# Patient Record
Sex: Male | Born: 1935 | Race: White | Hispanic: No | Marital: Married | Smoking: Never smoker
Health system: Southern US, Community
[De-identification: ages and names within clinical notes are randomized; demographics above are authoritative.]

## PROBLEM LIST (undated history)

## (undated) DIAGNOSIS — I499 Cardiac arrhythmia, unspecified: Secondary | ICD-10-CM

## (undated) HISTORY — PX: HERNIA REPAIR: SHX51

---

## 2001-05-02 ENCOUNTER — Encounter: Payer: Self-pay | Admitting: Urology

## 2001-05-02 ENCOUNTER — Encounter: Admission: RE | Admit: 2001-05-02 | Discharge: 2001-05-02 | Payer: Self-pay | Admitting: Urology

## 2001-05-03 ENCOUNTER — Ambulatory Visit (HOSPITAL_BASED_OUTPATIENT_CLINIC_OR_DEPARTMENT_OTHER): Admission: RE | Admit: 2001-05-03 | Discharge: 2001-05-03 | Payer: Self-pay | Admitting: Urology

## 2010-07-14 ENCOUNTER — Other Ambulatory Visit: Payer: Self-pay | Admitting: Internal Medicine

## 2010-07-14 DIAGNOSIS — N289 Disorder of kidney and ureter, unspecified: Secondary | ICD-10-CM

## 2010-08-04 ENCOUNTER — Other Ambulatory Visit: Payer: Self-pay

## 2012-11-04 ENCOUNTER — Emergency Department (HOSPITAL_COMMUNITY): Payer: Medicare Other

## 2012-11-04 ENCOUNTER — Emergency Department (HOSPITAL_COMMUNITY)
Admission: EM | Admit: 2012-11-04 | Discharge: 2012-11-04 | Disposition: A | Discharge status: Home / Self Care | Payer: Medicare Other | Attending: Emergency Medicine | Admitting: Emergency Medicine

## 2012-11-04 ENCOUNTER — Encounter (HOSPITAL_COMMUNITY): Payer: Self-pay | Admitting: Emergency Medicine

## 2012-11-04 DIAGNOSIS — F411 Generalized anxiety disorder: Secondary | ICD-10-CM | POA: Insufficient documentation

## 2012-11-04 DIAGNOSIS — I499 Cardiac arrhythmia, unspecified: Secondary | ICD-10-CM | POA: Insufficient documentation

## 2012-11-04 DIAGNOSIS — I498 Other specified cardiac arrhythmias: Secondary | ICD-10-CM

## 2012-11-04 DIAGNOSIS — R638 Other symptoms and signs concerning food and fluid intake: Secondary | ICD-10-CM | POA: Insufficient documentation

## 2012-11-04 DIAGNOSIS — R5381 Other malaise: Secondary | ICD-10-CM | POA: Insufficient documentation

## 2012-11-04 DIAGNOSIS — Z7982 Long term (current) use of aspirin: Secondary | ICD-10-CM | POA: Insufficient documentation

## 2012-11-04 DIAGNOSIS — R531 Weakness: Secondary | ICD-10-CM

## 2012-11-04 DIAGNOSIS — Z8679 Personal history of other diseases of the circulatory system: Secondary | ICD-10-CM | POA: Insufficient documentation

## 2012-11-04 DIAGNOSIS — R42 Dizziness and giddiness: Secondary | ICD-10-CM | POA: Insufficient documentation

## 2012-11-04 DIAGNOSIS — Z79899 Other long term (current) drug therapy: Secondary | ICD-10-CM | POA: Insufficient documentation

## 2012-11-04 DIAGNOSIS — R5383 Other fatigue: Secondary | ICD-10-CM

## 2012-11-04 DIAGNOSIS — E039 Hypothyroidism, unspecified: Secondary | ICD-10-CM | POA: Insufficient documentation

## 2012-11-04 DIAGNOSIS — F419 Anxiety disorder, unspecified: Secondary | ICD-10-CM

## 2012-11-04 HISTORY — DX: Cardiac arrhythmia, unspecified: I49.9

## 2012-11-04 LAB — CBC WITH DIFFERENTIAL/PLATELET
HCT: 36.2 % — ABNORMAL LOW (ref 39.0–52.0)
Hemoglobin: 13 g/dL (ref 13.0–17.0)
Lymphocytes Relative: 6 % — ABNORMAL LOW (ref 12–46)
Lymphs Abs: 0.8 10*3/uL (ref 0.7–4.0)
Monocytes Absolute: 1.5 10*3/uL — ABNORMAL HIGH (ref 0.1–1.0)
Monocytes Relative: 11 % (ref 3–12)
Neutro Abs: 10.6 10*3/uL — ABNORMAL HIGH (ref 1.7–7.7)
Neutrophils Relative %: 81 % — ABNORMAL HIGH (ref 43–77)
RBC: 3.9 MIL/uL — ABNORMAL LOW (ref 4.22–5.81)

## 2012-11-04 LAB — URINE MICROSCOPIC-ADD ON

## 2012-11-04 LAB — COMPREHENSIVE METABOLIC PANEL
Albumin: 2.1 g/dL — ABNORMAL LOW (ref 3.5–5.2)
Alkaline Phosphatase: 66 U/L (ref 39–117)
BUN: 19 mg/dL (ref 6–23)
CO2: 20 mEq/L (ref 19–32)
Chloride: 95 mEq/L — ABNORMAL LOW (ref 96–112)
Creatinine, Ser: 1.1 mg/dL (ref 0.50–1.35)
GFR calc non Af Amer: 59 mL/min — ABNORMAL LOW (ref >90)
Glucose, Bld: 132 mg/dL — ABNORMAL HIGH (ref 70–99)
Potassium: 4.1 mEq/L (ref 3.5–5.1)
Total Bilirubin: 0.3 mg/dL (ref 0.3–1.2)

## 2012-11-04 LAB — URINALYSIS, ROUTINE W REFLEX MICROSCOPIC
Glucose, UA: NEGATIVE mg/dL
Ketones, ur: NEGATIVE mg/dL
Leukocytes, UA: NEGATIVE
Protein, ur: 30 mg/dL — AB
Urobilinogen, UA: 1 mg/dL (ref 0.0–1.0)

## 2012-11-04 MED ORDER — SODIUM CHLORIDE 0.9 % IV BOLUS (SEPSIS)
500.0000 mL | Freq: Once | INTRAVENOUS | Status: AC
  Administered 2012-11-04: 500 mL via INTRAVENOUS

## 2012-11-04 NOTE — ED Notes (Signed)
Patient can not get an urine at this time will try later 

## 2012-11-04 NOTE — ED Notes (Signed)
Cardiology at bedside.

## 2012-11-04 NOTE — ED Notes (Signed)
Onset 3 months ago general weakness loss of appetite seen Primary Doctor twice past week last being one day ago.  Primary Doctor referred patient to Cardiology however have not made appointment at this time.  Called Cardiology sent patient to ED for evaluation. Patient alert answering and following commands appropriate.

## 2012-11-04 NOTE — ED Notes (Signed)
1920  Received report from off going RN and introduced myself to the pt.  No new needs at this time but will continue to monitor

## 2012-11-04 NOTE — ED Provider Notes (Signed)
  Physical Exam  BP 129/82  Pulse 102  Temp(Src) 97.7 F (36.5 C) (Oral)  Resp 16  SpO2 96%  Physical Exam  ED Course  Procedures  MDM Dr. Ranae Palms signing out patient to me. States that patient had come in for generalized weakness. Has non specific leukocytosis with no source of infection and mild hyponatremia. He was to d/c the patient, when patients HR became erratic. [facility] was called -and they have recommended close follow up. Pt is comfortable at this time with the plan to be discharged. He is slightly tachycardic. He is aware that he needs to return to the ER immediately if symptoms get worse. Family at bedside, understand the plan as well.  Joshua Kaplan, MD 11/04/12 1944

## 2012-11-04 NOTE — Consult Note (Signed)
Pt. Seen and examined. Agree with the NP/PA-C note as written. Pleasant 79 yo male who is going to be seeing Dr. Alanda Amass for sinus arrythmia. Apparently he was told of this abnormal rhythm when he was trying to enlist in the [employer] 60 years ago. He now presents with 2 weeks of progressive weakness, secondary to URI which has not totally resolved. He was given antibiotics yesterday, but has only taken 1 pill.  On admission, labs indicate significant dehydration. He was given 1.5L of normal saline and is feeling stronger. There is a leukocytosis. He denies chest pain, syncope, pre-syncope, palpitations or any associated symptoms.  EKG shows "grouped beating".  Impression: 1. Weakness secondary to dehydration and infection 2. Arrythmia  Plan: 1.  I don't feel that his weakness is related to his long-standing arrythmia. The office will contact him on Monday to schedule a follow-up appointment with Dr. Alanda Amass. Probably can go home to continue oral antibiotics, get rest and adequate hydration, as per the ER physician's discretion.   Thanks for consulting [place].  Chrystie Nose, MD, [facility] Attending Cardiologist [facility]

## 2012-11-04 NOTE — ED Notes (Signed)
Paged [facility] w/[facility] to [phone]

## 2012-11-04 NOTE — ED Provider Notes (Signed)
CSN: [id number]     Arrival date & time 11/04/12  4098 History     First MD Initiated Contact with Patient 11/04/12 [phone]     Chief Complaint  Patient presents with  . Weakness   (Consider location/radiation/quality/duration/timing/severity/associated sxs/prior Treatment) HPI Pt with several weeks of "weakness" episodes. States they are associated with lightheadedness. No fever, chills, CP, SOB, cough, abd pain, N/V/D, urinary symptoms or focal weakness/numbness. Has known "irregular" heart beat he was diagnosed with >60 years ago. Wife states he was recently place on abx for elevated WBC but no known focus of infection. Pt c/o dry mouth and decreased PO intake.  Past Medical History  Diagnosis Date  . Irregular heartbeat    Past Surgical History  Procedure Laterality Date  . Hernia repair     No family history on file. History  Substance Use Topics  . Smoking status: Never Smoker   . Smokeless tobacco: Not on file  . Alcohol Use: No    Review of Systems  Constitutional: Positive for appetite change and fatigue. Negative for fever and chills.  HENT: Negative for neck pain and neck stiffness.   Eyes: Negative for visual disturbance.  Respiratory: Negative for cough, shortness of breath and wheezing.   Cardiovascular: Negative for chest pain, palpitations and leg swelling.  Gastrointestinal: Negative for nausea, vomiting, abdominal pain and diarrhea.  Genitourinary: Negative for dysuria, frequency and hematuria.  Musculoskeletal: Negative for myalgias and back pain.  Skin: Negative for rash and wound.  Neurological: Positive for dizziness, weakness (generalized) and light-headedness. Negative for syncope, numbness and headaches.  All other systems reviewed and are negative.    Allergies  Fish oil  Home Medications   Current Outpatient Rx  Name  Route  Sig  Dispense  Refill  . ALPRAZolam (XANAX) 0.25 MG tablet   Oral   Take 0.25 mg by mouth at bedtime as needed for  sleep.         Marland Kitchen aspirin EC 81 MG tablet   Oral   Take 81 mg by mouth daily.         . Cholecalciferol (VITAMIN D-3 PO)   Oral   Take 1 tablet by mouth daily.         . Choline Fenofibrate (TRILIPIX) 45 MG capsule   Oral   Take 45 mg by mouth daily.         . Cranberry 500 MG CAPS   Oral   Take 1 capsule by mouth daily.         Marland Kitchen levothyroxine (SYNTHROID, LEVOTHROID) 50 MCG tablet   Oral   Take 50 mcg by mouth daily before breakfast.         . mirtazapine (REMERON) 15 MG tablet   Oral   Take 15 mg by mouth at bedtime.         . pantoprazole (PROTONIX) 40 MG tablet   Oral   Take 40 mg by mouth daily.         . rosuvastatin (CRESTOR) 20 MG tablet   Oral   Take 20 mg by mouth daily.         . vitamin B-12 (CYANOCOBALAMIN) 1000 MCG tablet   Oral   Take 1,000 mcg by mouth daily.         . vitamin C (ASCORBIC ACID) 500 MG tablet   Oral   Take 500 mg by mouth daily.          BP 132/75  Pulse 101  Temp(Src) 97.7 F (36.5 C) (Oral)  Resp 18  SpO2 95% Physical Exam  Nursing note and vitals reviewed. Constitutional: He is oriented to person, place, and time. He appears well-developed. No distress.  Dry and wasted appearing  HENT:  Head: Normocephalic and atraumatic.  Dry MM  Eyes: EOM are normal. Pupils are equal, round, and reactive to light.  Neck: Normal range of motion. Neck supple.  Cardiovascular:  Tachy irreg irreg  Pulmonary/Chest: Effort normal and breath sounds normal. No respiratory distress. He has no wheezes. He has no rales. He exhibits no tenderness.  Abdominal: Soft. Bowel sounds are normal. He exhibits no distension and no mass. There is no tenderness. There is no rebound and no guarding.  Musculoskeletal: Normal range of motion. He exhibits no edema and no tenderness.  Neurological: He is alert and oriented to person, place, and time.  5/5 motor in all ext, sensation intact  Skin: Skin is warm and dry. No rash noted. No  erythema.  Psychiatric: He has a normal mood and affect. His behavior is normal.    ED Course   Procedures (including critical care time)  Labs Reviewed  CBC WITH DIFFERENTIAL - Abnormal; Notable for the following:    WBC 13.0 (*)    RBC 3.90 (*)    HCT 36.2 (*)    Platelets 503 (*)    Neutrophils Relative % 81 (*)    Neutro Abs 10.6 (*)    Lymphocytes Relative 6 (*)    Monocytes Absolute 1.5 (*)    All other components within normal limits  COMPREHENSIVE METABOLIC PANEL - Abnormal; Notable for the following:    Sodium 127 (*)    Chloride 95 (*)    Glucose, Bld 132 (*)    Albumin 2.1 (*)    AST 68 (*)    GFR calc non Af Amer 59 (*)    GFR calc Af Amer 69 (*)    All other components within normal limits  URINALYSIS, ROUTINE W REFLEX MICROSCOPIC - Abnormal; Notable for the following:    Protein, ur 30 (*)    All other components within normal limits  URINE MICROSCOPIC-ADD ON - Abnormal; Notable for the following:    Squamous Epithelial / LPF FEW (*)    Crystals CA OXALATE CRYSTALS (*)    All other components within normal limits  POCT I-STAT TROPONIN I   Dg Chest 2 View  11/04/2012   *RADIOLOGY REPORT*  Clinical Data: Weakness.  CHEST - 2 VIEW  Comparison: None.  Findings: Heart is normal size.  No confluent airspace opacities or effusions.  Slight increased interstitial prominence in the lung bases.  No acute bony abnormality.  IMPRESSION: No acute cardiopulmonary disease.   Original Report Authenticated By: Charlett Nose, M.D.   No diagnosis found.  Date: 11/04/2012  Rate: 111  Rhythm: sinus tachycardia  QRS Axis: normal  Intervals: qrs prolonged  ST/T Wave abnormalities: nonspecific T wave changes  Conduction Disutrbances:right bundle branch block  Narrative Interpretation:   Old EKG Reviewed: none available Irregular rhythm.   MDM  Pt stated he was feeling better after IVF's. Got up to ambulate and HR increased into 140-150's though pt remained asymptomatic.  Discussed with [facility] card PA and will see in ED and aid with disposition.   Loren Racer, MD 11/04/12 [phone]

## 2012-11-04 NOTE — Consult Note (Signed)
Reason for Consult: Sinus arrythmia/tachycardia and weakness Referring Physician: [facility]   HPI: The patient is a 79 y/o male with no prior cardiac history. He was told over 60 years ago during a physical for the [place] [employer] that he had an irregular heartbeat, but has never had any associated symptoms. His PCP is Dr. Selena Batten of [facility]. He has a medical history significant for hypothyroidism and anxiety. No significant cardiac risk factors and no significant family history. He has been seen recently by his PCP for generalized weakness and was advised to see a cardiologist for evaluation of his arrythmia. His wife is followed by Dr. Alanda Amass and desires him to be followed by [facility]. The patient called for an appointment today, but was apparently instructed to report to the ER for evaluation of his arrythmia. In addition to weakness, he endorses occasional lightheadedness. He denies syncope/presyncope. No chest pain, SOB or palpitations. In the ER he was noted to have an irregular heart rhythm. EKGs show presence of p waves. While in the ER, his HR increased to the 140s-150s while ambulating, however he remained asymptomatic. His BP has remained stable. After resting, his HR improved to the upper 90s- low 100s.   Past Medical History  Diagnosis Date  . Irregular heartbeat     Past Surgical History  Procedure Laterality Date  . Hernia repair      Family History  Problem Relation Age of Onset  . Hypertension Father     Social History:  reports that he has never smoked. He does not have any smokeless tobacco history on file. He reports that he does not drink alcohol or use illicit drugs.  Allergies:  Allergies  Allergen Reactions  . Fish Oil Nausea Only    Medications:  Prior to Admission medications   Medication Sig Start Date End Date Taking? Authorizing Provider  ALPRAZolam Prudy Feeler) 0.25 MG tablet Take 0.25 mg by mouth at bedtime as needed for sleep.   Yes Historical  Provider, MD  aspirin EC 81 MG tablet Take 81 mg by mouth daily.   Yes Historical Provider, MD  Cholecalciferol (VITAMIN D-3 PO) Take 1 tablet by mouth daily.   Yes Historical Provider, MD  Choline Fenofibrate (TRILIPIX) 45 MG capsule Take 45 mg by mouth daily.   Yes Historical Provider, MD  Cranberry 500 MG CAPS Take 1 capsule by mouth daily.   Yes Historical Provider, MD  levothyroxine (SYNTHROID, LEVOTHROID) 50 MCG tablet Take 50 mcg by mouth daily before breakfast.   Yes Historical Provider, MD  mirtazapine (REMERON) 15 MG tablet Take 15 mg by mouth at bedtime.   Yes Historical Provider, MD  pantoprazole (PROTONIX) 40 MG tablet Take 40 mg by mouth daily.   Yes Historical Provider, MD  rosuvastatin (CRESTOR) 20 MG tablet Take 20 mg by mouth daily.   Yes Historical Provider, MD  vitamin B-12 (CYANOCOBALAMIN) 1000 MCG tablet Take 1,000 mcg by mouth daily.   Yes Historical Provider, MD  vitamin C (ASCORBIC ACID) 500 MG tablet Take 500 mg by mouth daily.   Yes Historical Provider, MD     Results for orders placed during the hospital encounter of 11/04/12 (from the past 48 hour(s))  CBC WITH DIFFERENTIAL     Status: Abnormal   Collection Time    11/04/12 10:30 AM      Result Value Range   WBC 13.0 (*) 4.0 - 10.5 K/uL   RBC 3.90 (*) 4.22 - 5.81 MIL/uL   Hemoglobin 13.0  13.0 - 17.0 g/dL   HCT 16.1 (*) 09.6 - 04.5 %   MCV 92.8  78.0 - 100.0 fL   MCH 33.3  26.0 - 34.0 pg   MCHC 35.9  30.0 - 36.0 g/dL   RDW 40.9  81.1 - 91.4 %   Platelets 503 (*) 150 - 400 K/uL   Neutrophils Relative % 81 (*) 43 - 77 %   Neutro Abs 10.6 (*) 1.7 - 7.7 K/uL   Lymphocytes Relative 6 (*) 12 - 46 %   Lymphs Abs 0.8  0.7 - 4.0 K/uL   Monocytes Relative 11  3 - 12 %   Monocytes Absolute 1.5 (*) 0.1 - 1.0 K/uL   Eosinophils Relative 1  0 - 5 %   Eosinophils Absolute 0.1  0.0 - 0.7 K/uL   Basophils Relative 0  0 - 1 %   Basophils Absolute 0.0  0.0 - 0.1 K/uL  COMPREHENSIVE METABOLIC PANEL     Status: Abnormal    Collection Time    11/04/12 10:30 AM      Result Value Range   Sodium 127 (*) 135 - 145 mEq/L   Potassium 4.1  3.5 - 5.1 mEq/L   Chloride 95 (*) 96 - 112 mEq/L   CO2 20  19 - 32 mEq/L   Glucose, Bld 132 (*) 70 - 99 mg/dL   BUN 19  6 - 23 mg/dL   Creatinine, Ser 7.82  0.50 - 1.35 mg/dL   Calcium 8.7  8.4 - 95.6 mg/dL   Total Protein 6.3  6.0 - 8.3 g/dL   Albumin 2.1 (*) 3.5 - 5.2 g/dL   AST 68 (*) 0 - 37 U/L   ALT 43  0 - 53 U/L   Alkaline Phosphatase 66  39 - 117 U/L   Total Bilirubin 0.3  0.3 - 1.2 mg/dL   GFR calc non Af Amer 59 (*) >90 mL/min   GFR calc Af Amer 69 (*) >90 mL/min   Comment:            The eGFR has been calculated     using the CKD EPI equation.     This calculation has not been     validated in all clinical     situations.     eGFR's persistently     <90 mL/min signify     possible Chronic Kidney Disease.  POCT I-STAT TROPONIN I     Status: None   Collection Time    11/04/12 10:38 AM      Result Value Range   Troponin i, poc 0.01  0.00 - 0.08 ng/mL   Comment 3            Comment: Due to the release kinetics of cTnI,     a negative result within the first hours     of the onset of symptoms does not rule out     myocardial infarction with certainty.     If myocardial infarction is still suspected,     repeat the test at appropriate intervals.  URINALYSIS, ROUTINE W REFLEX MICROSCOPIC     Status: Abnormal   Collection Time    11/04/12  1:42 PM      Result Value Range   Color, Urine YELLOW  YELLOW   APPearance CLEAR  CLEAR   Specific Gravity, Urine 1.026  1.005 - 1.030   pH 6.0  5.0 - 8.0   Glucose, UA NEGATIVE  NEGATIVE mg/dL   Hgb urine  dipstick NEGATIVE  NEGATIVE   Bilirubin Urine NEGATIVE  NEGATIVE   Ketones, ur NEGATIVE  NEGATIVE mg/dL   Protein, ur 30 (*) NEGATIVE mg/dL   Urobilinogen, UA 1.0  0.0 - 1.0 mg/dL   Nitrite NEGATIVE  NEGATIVE   Leukocytes, UA NEGATIVE  NEGATIVE  URINE MICROSCOPIC-ADD ON     Status: Abnormal   Collection Time     11/04/12  1:42 PM      Result Value Range   Squamous Epithelial / LPF FEW (*) RARE   WBC, UA 0-2  <3 WBC/hpf   Crystals CA OXALATE CRYSTALS (*) NEGATIVE   Urine-Other MUCOUS PRESENT      Dg Chest 2 View  11/04/2012   *RADIOLOGY REPORT*  Clinical Data: Weakness.  CHEST - 2 VIEW  Comparison: None.  Findings: Heart is normal size.  No confluent airspace opacities or effusions.  Slight increased interstitial prominence in the lung bases.  No acute bony abnormality.  IMPRESSION: No acute cardiopulmonary disease.   Original Report Authenticated By: Charlett Nose, M.D.    Review of Systems  Constitutional: Positive for malaise/fatigue.  Respiratory: Negative for shortness of breath.   Cardiovascular: Negative for chest pain, palpitations, claudication and leg swelling.  Neurological: Positive for dizziness and weakness. Negative for loss of consciousness.  All other systems reviewed and are negative.   Blood pressure 120/75, pulse 102, temperature 97.7 F (36.5 C), temperature source Oral, resp. rate 20, SpO2 95.00%. Physical Exam  Constitutional: He appears well-developed and well-nourished. No distress.  Neck: No JVD present. Carotid bruit is not present.  Cardiovascular: Normal rate, normal heart sounds and intact distal pulses.  Exam reveals no gallop and no friction rub.   No murmur heard. Pulses:      Radial pulses are 2+ on the right side, and 2+ on the left side.       Dorsalis pedis pulses are 2+ on the right side, and 2+ on the left side.  Respiratory: Effort normal and breath sounds normal. No respiratory distress. He has no wheezes. He has no rales.  GI: Soft. Bowel sounds are normal. He exhibits no distension and no mass. There is no tenderness.  Musculoskeletal: He exhibits no edema.  Skin: Skin is warm and dry. He is not diaphoretic.  Psychiatric: He has a normal mood and affect. His behavior is normal.    Assessment/Plan: Principal Problem:   Weakness  generalized Active Problems:   Sinus arrhythmia   Anxiety   Plan: 79 y/o male with no past cardiac history and no significant cardiac risk factors presents to ER with complaint of generalized weakness and fatigue, in the setting of sinus arrythmia/tachycardia. Apparently, he has had this arrythmia for over 60 years now and has been fairly asymptomatic. His HR increased into the 140s-150s earlier, however this was while ambulating the halls in the ER. No change in symptoms with increased HR. No chest pain, SOB, palpitations or syncope. HR is currently in the upper 90s-low 100s. EKG shows sinus arrythmia with identifiable p-waves. Exam is benign. POC troponin is negative. WBC is elevated. CXR is benign. UA not suggestive of UTI. ? Discharging home with 30 day Holter monitor and f/u at [facility] vs further monitoring overnight.   Allayne Butcher, PA-C 11/04/2012, 5:50 PM

## 2012-11-04 NOTE — ED Notes (Signed)
Walk patient with the pulse oxy patient did great, he stated that he feels mush better his oxygen level was 93 walking and sitting postion it was 96 room air

## 2012-11-08 ENCOUNTER — Other Ambulatory Visit: Payer: Self-pay | Admitting: *Deleted

## 2012-11-08 DIAGNOSIS — R0989 Other specified symptoms and signs involving the circulatory and respiratory systems: Secondary | ICD-10-CM

## 2012-11-11 ENCOUNTER — Ambulatory Visit (HOSPITAL_COMMUNITY)
Admission: RE | Admit: 2012-11-11 | Discharge: 2012-11-11 | Disposition: A | Discharge status: Home / Self Care | Payer: Medicare Other | Source: Ambulatory Visit | Attending: Cardiovascular Disease | Admitting: Cardiovascular Disease

## 2012-11-11 DIAGNOSIS — R0989 Other specified symptoms and signs involving the circulatory and respiratory systems: Secondary | ICD-10-CM | POA: Insufficient documentation

## 2012-11-11 NOTE — Progress Notes (Signed)
Carotid Duplex Completed. Hisako Bugh, BS, RDMS, RVT  

## 2012-11-14 ENCOUNTER — Encounter: Payer: Self-pay | Admitting: Cardiovascular Disease

## 2012-11-22 ENCOUNTER — Other Ambulatory Visit: Payer: Self-pay | Admitting: Internal Medicine

## 2012-11-22 DIAGNOSIS — R4189 Other symptoms and signs involving cognitive functions and awareness: Secondary | ICD-10-CM

## 2012-11-22 DIAGNOSIS — J849 Interstitial pulmonary disease, unspecified: Secondary | ICD-10-CM

## 2012-11-28 ENCOUNTER — Other Ambulatory Visit: Payer: BC Managed Care – PPO

## 2012-11-29 ENCOUNTER — Other Ambulatory Visit: Payer: BC Managed Care – PPO

## 2013-08-15 ENCOUNTER — Emergency Department (HOSPITAL_COMMUNITY)
Admission: EM | Admit: 2013-08-15 | Discharge: 2013-09-11 | Disposition: E | Payer: Medicare Other | Attending: Emergency Medicine | Admitting: Emergency Medicine

## 2013-08-15 ENCOUNTER — Encounter (HOSPITAL_COMMUNITY): Payer: Self-pay | Admitting: Emergency Medicine

## 2013-08-15 DIAGNOSIS — I469 Cardiac arrest, cause unspecified: Secondary | ICD-10-CM | POA: Insufficient documentation

## 2013-08-15 DIAGNOSIS — Z79899 Other long term (current) drug therapy: Secondary | ICD-10-CM | POA: Insufficient documentation

## 2013-08-15 DIAGNOSIS — I498 Other specified cardiac arrhythmias: Secondary | ICD-10-CM | POA: Insufficient documentation

## 2013-08-15 DIAGNOSIS — Z7982 Long term (current) use of aspirin: Secondary | ICD-10-CM | POA: Insufficient documentation

## 2013-08-15 MED ORDER — ATROPINE SULFATE 1 MG/ML IJ SOLN
INTRAMUSCULAR | Status: AC | PRN
  Administered 2013-08-15: 1 mg via INTRAVENOUS

## 2013-08-15 MED ORDER — SODIUM BICARBONATE 8.4 % IV SOLN
INTRAVENOUS | Status: AC | PRN
  Administered 2013-08-15: 50 meq via INTRAVENOUS

## 2013-08-15 MED ORDER — CALCIUM CHLORIDE 10 % IV SOLN
INTRAVENOUS | Status: AC | PRN
  Administered 2013-08-15: 1 g via INTRAVENOUS

## 2013-08-15 MED FILL — Medication: Qty: 1 | Status: AC

## 2013-09-11 NOTE — ED Notes (Signed)
Bed: RESA Expected date:  Expected time:  Means of arrival:  Comments: CPR

## 2013-09-11 NOTE — ED Provider Notes (Signed)
CSN: [id number]     Arrival date & time 08/29/2013  1651 History   First MD Initiated Contact with Patient 2013-11-29 1655     Chief Complaint  Patient presents with  . Cardiac Arrest     (Consider location/radiation/quality/duration/timing/severity/associated sxs/prior Treatment) HPI Comments: The pt is a 80 y/o male who presents in cardiac arrest from home.  According to EMS, the pt was in the car after being out with his wife, when she witnessed a cardiac arrest, and she called 911 immediately, and EMS found the patient to be in ventricular fibrillation. They give a defibrillation which caused the patient to go into PEA. There was no return of spontaneous circulation, the patient was given cool IV fluids, access to his right antecubital vein as well as right intraosseous access were obtained, 10 doses of epinephrine were given prior to arrival without return of spontaneous circulation. The patient was intubated on the scene, mechanical ventilation was provided throughout, CPR was provided throughout. On the time of arrival the patient had a downtime of approximately 52 minutes.  The history is provided by the EMS personnel and the spouse.    Past Medical History  Diagnosis Date  . Irregular heartbeat    Past Surgical History  Procedure Laterality Date  . Hernia repair     Family History  Problem Relation Age of Onset  . Hypertension Father    History  Substance Use Topics  . Smoking status: Never Smoker   . Smokeless tobacco: Not on file  . Alcohol Use: No    Review of Systems  Unable to perform ROS: Patient unresponsive      Allergies  Fish oil  Home Medications   Prior to Admission medications   Medication Sig Start Date End Date Taking? Authorizing Provider  ALPRAZolam Prudy Feeler(XANAX) 0.25 MG tablet Take 0.25 mg by mouth at bedtime as needed for sleep.    Historical Provider, MD  aspirin EC 81 MG tablet Take 81 mg by mouth daily.    Historical Provider, MD  Cholecalciferol  (VITAMIN D-3 PO) Take 1 tablet by mouth daily.    Historical Provider, MD  Choline Fenofibrate (TRILIPIX) 45 MG capsule Take 45 mg by mouth daily.    Historical Provider, MD  Cranberry 500 MG CAPS Take 1 capsule by mouth daily.    Historical Provider, MD  levothyroxine (SYNTHROID, LEVOTHROID) 50 MCG tablet Take 50 mcg by mouth daily before breakfast.    Historical Provider, MD  mirtazapine (REMERON) 15 MG tablet Take 15 mg by mouth at bedtime.    Historical Provider, MD  pantoprazole (PROTONIX) 40 MG tablet Take 40 mg by mouth daily.    Historical Provider, MD  rosuvastatin (CRESTOR) 20 MG tablet Take 20 mg by mouth daily.    Historical Provider, MD  vitamin B-12 (CYANOCOBALAMIN) 1000 MCG tablet Take 1,000 mcg by mouth daily.    Historical Provider, MD  vitamin C (ASCORBIC ACID) 500 MG tablet Take 500 mg by mouth daily.    Historical Provider, MD   There were no vitals taken for this visit. Physical Exam  Nursing note and vitals reviewed. Constitutional: He appears well-developed and well-nourished. He appears distressed.  HENT:  Head: Normocephalic and atraumatic.  Mouth/Throat: Oropharynx is clear and moist. No oropharyngeal exudate.  Eyes: Conjunctivae are normal. Right eye exhibits no discharge. Left eye exhibits no discharge. No scleral icterus.  Neck: Normal range of motion. Neck supple. No JVD present. No thyromegaly present.  Cardiovascular:  Cardiac monitoring reveals bradycardic  pulseless electrical activity with a pulse between 30 and 50 beats per minute  Pulmonary/Chest: Breath sounds normal.  Breath sounds heard bilaterally with mechanical ventilation, endotracheal tube confirmed in the trachea was clinical exam  Abdominal: Soft. Bowel sounds are normal. He exhibits no distension and no mass. There is no tenderness.  Musculoskeletal: Normal range of motion. He exhibits no edema and no tenderness.  Intraosseous line found in the right lower extremity anterior proximal tibia,  compartments are soft, joints are supple  Lymphadenopathy:    He has no cervical adenopathy.  Neurological:  Unresponsive, GCS 3  Skin: Skin is warm and dry. No rash noted. No erythema.  Psychiatric: He has a normal mood and affect. His behavior is normal.    ED Course  Procedures (including critical care time) Labs Review Labs Reviewed - No data to display  Imaging Review No results found.    MDM   Final diagnoses:  Cardiac arrest    The patient appears his stated age, he is in severe distress secondary to cardiac arrest, the patient was given multiple interventions during his brief emergency department resuscitation including bicarbonate, calcium, atropine, pericardiocentesis which did yield approximately 12 cc of dark brown liquid which did not appear to be intracardiac blood. There was no return of spontaneous circulation, after one hour of persistent downtime, bedside ultrasound showed no signs of cardiac activity, pupils were bilaterally blown and nonreactive to light. The patient was declared dead.  Procedure note  Pericardiocentesis  Patient was in the supine position, indication cardiac arrest and pulseless electrical activity  Consent implied due to emergency situation  20-gauge lumbar puncture needle placed on 10 cc syringe, advanced in the subxiphoid area towards the left shoulder at approximately 15 with 12 cc of dark brown liquid withdrawn from the pericardial space, this did not appear to be intracardiac blood. No return of spontaneous circulation  Cardiopulmonary Resuscitation (CPR) Procedure Note Directed/Performed by: Vida RollerBrian D Ellinor Test I personally directed ancillary staff and/or performed CPR in an effort to regain return of spontaneous circulation and to maintain cardiac, neuro and systemic perfusion.    Discussed with family doctor on-call, Dr. Renne CriglerPharr who agrees to sign the death significant period 5:25 PM  Discussed with family members, they have been  made aware of the patient's passing.   Vida RollerBrian D Rosalia Mcavoy, MD May 02, 2013 1739
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
## 2013-09-11 NOTE — Code Documentation (Signed)
Patient time of death occurred at [id number].
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
## 2013-09-11 NOTE — ED Notes (Signed)
Called [facility]. Spoke with Barbette Orionne Garner at [facility]. Pt is not a candidate for donor services. Referral # [id number].

## 2013-09-11 DEATH — deceased

## 2014-07-14 IMAGING — CR DG CHEST 2V
1 series · 1 of 1 positions shown · non-contrast
Comparison: None.

CLINICAL DATA: Weakness.

CHEST - 2 VIEW

[view not recorded]
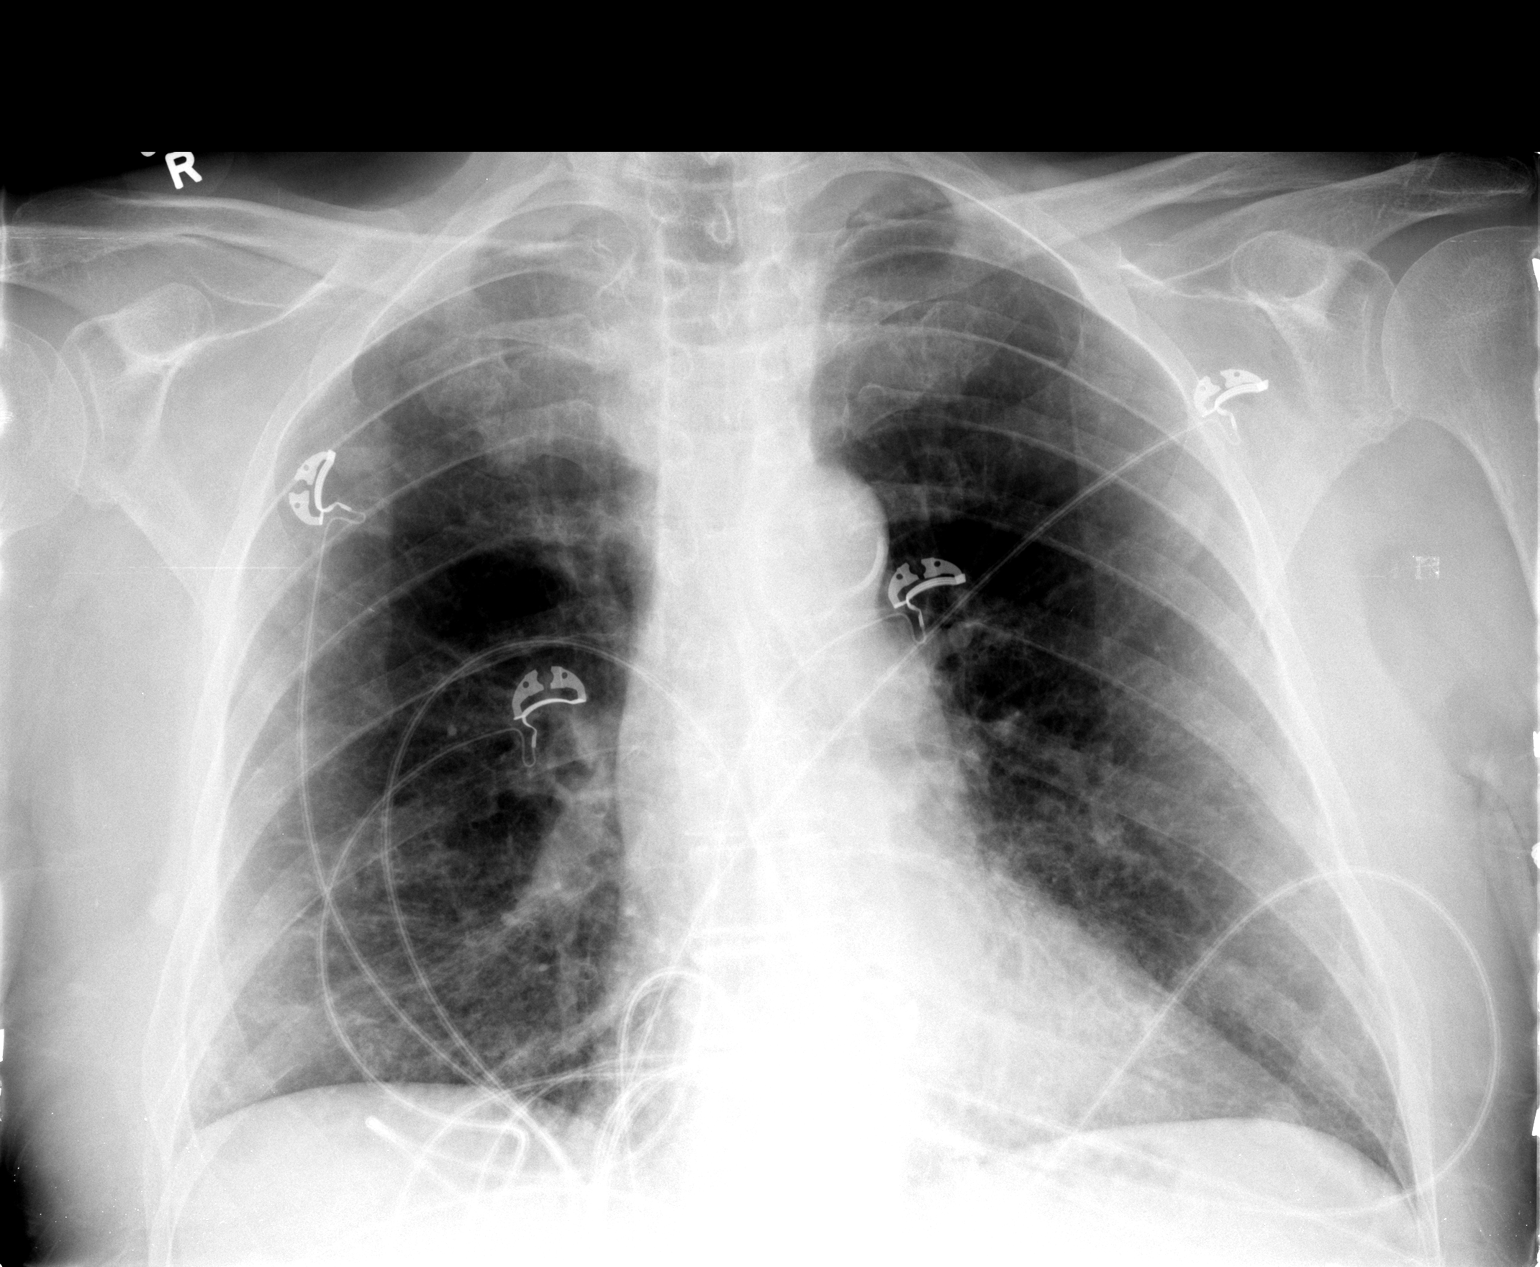

[1 of 1 positions shown; findings below may reference images not displayed]

FINDINGS: Heart is normal size.  No confluent airspace opacities or
effusions.  Slight increased interstitial prominence in the lung
bases.  No acute bony abnormality.
IMPRESSION: No acute cardiopulmonary disease.

## 2014-10-26 ENCOUNTER — Encounter: Payer: Self-pay | Admitting: *Deleted
